# Patient Record
Sex: Female | Born: 1945
Health system: Southern US, Community
[De-identification: ages and names within clinical notes are randomized; demographics above are authoritative.]

## PROBLEM LIST (undated history)

## (undated) DIAGNOSIS — K219 Gastro-esophageal reflux disease without esophagitis: Secondary | ICD-10-CM

## (undated) DIAGNOSIS — E785 Hyperlipidemia, unspecified: Secondary | ICD-10-CM

## (undated) HISTORY — DX: Hyperlipidemia, unspecified: E78.5

## (undated) HISTORY — DX: Gastro-esophageal reflux disease without esophagitis: K21.9

## (undated) HISTORY — PX: LAPAROSCOPY ABDOMEN DIAGNOSTIC: PRO50

---
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
## 1988-02-14 HISTORY — PX: TUBAL LIGATION: SHX77

## 1999-06-14 ENCOUNTER — Other Ambulatory Visit: Admission: RE | Admit: 1999-06-14 | Discharge: 1999-06-14 | Payer: Self-pay | Admitting: Family Medicine

## 1999-06-22 ENCOUNTER — Encounter: Admission: RE | Admit: 1999-06-22 | Discharge: 1999-06-22 | Payer: Self-pay | Admitting: Family Medicine

## 1999-06-22 ENCOUNTER — Encounter: Payer: Self-pay | Admitting: Family Medicine

## 1999-07-04 ENCOUNTER — Encounter: Payer: Self-pay | Admitting: Internal Medicine

## 1999-07-13 ENCOUNTER — Ambulatory Visit (HOSPITAL_COMMUNITY): Admission: RE | Admit: 1999-07-13 | Discharge: 1999-07-13 | Payer: Self-pay | Admitting: Gastroenterology

## 1999-07-13 ENCOUNTER — Encounter: Payer: Self-pay | Admitting: Internal Medicine

## 1999-11-21 ENCOUNTER — Encounter: Payer: Self-pay | Admitting: Family Medicine

## 1999-11-21 ENCOUNTER — Encounter: Admission: RE | Admit: 1999-11-21 | Discharge: 1999-11-21 | Payer: Self-pay | Admitting: Family Medicine

## 2000-11-22 ENCOUNTER — Encounter: Payer: Self-pay | Admitting: Family Medicine

## 2000-11-22 ENCOUNTER — Encounter: Admission: RE | Admit: 2000-11-22 | Discharge: 2000-11-22 | Payer: Self-pay | Admitting: Family Medicine

## 2001-08-19 ENCOUNTER — Other Ambulatory Visit: Admission: RE | Admit: 2001-08-19 | Discharge: 2001-08-19 | Payer: Self-pay | Admitting: Family Medicine

## 2001-08-29 ENCOUNTER — Encounter: Admission: RE | Admit: 2001-08-29 | Discharge: 2001-08-29 | Payer: Self-pay | Admitting: Family Medicine

## 2001-08-29 ENCOUNTER — Encounter: Payer: Self-pay | Admitting: Family Medicine

## 2002-01-14 ENCOUNTER — Encounter: Payer: Self-pay | Admitting: Family Medicine

## 2002-01-14 ENCOUNTER — Encounter: Admission: RE | Admit: 2002-01-14 | Discharge: 2002-01-14 | Payer: Self-pay | Admitting: Family Medicine

## 2002-08-25 ENCOUNTER — Other Ambulatory Visit: Admission: RE | Admit: 2002-08-25 | Discharge: 2002-08-25 | Payer: Self-pay | Admitting: Obstetrics and Gynecology

## 2003-10-09 ENCOUNTER — Other Ambulatory Visit: Admission: RE | Admit: 2003-10-09 | Discharge: 2003-10-09 | Payer: Self-pay | Admitting: Obstetrics and Gynecology

## 2003-11-03 ENCOUNTER — Encounter: Admission: RE | Admit: 2003-11-03 | Discharge: 2003-11-03 | Payer: Self-pay | Admitting: Obstetrics and Gynecology

## 2004-11-23 ENCOUNTER — Other Ambulatory Visit: Admission: RE | Admit: 2004-11-23 | Discharge: 2004-11-23 | Payer: Self-pay | Admitting: Obstetrics and Gynecology

## 2004-12-26 ENCOUNTER — Encounter: Admission: RE | Admit: 2004-12-26 | Discharge: 2004-12-26 | Payer: Self-pay | Admitting: Obstetrics and Gynecology

## 2005-02-13 HISTORY — PX: CARPAL TUNNEL RELEASE: SHX101

## 2005-07-13 ENCOUNTER — Ambulatory Visit: Payer: Self-pay | Admitting: Internal Medicine

## 2006-01-23 ENCOUNTER — Encounter: Admission: RE | Admit: 2006-01-23 | Discharge: 2006-01-23 | Payer: Self-pay | Admitting: Internal Medicine

## 2007-06-05 ENCOUNTER — Encounter: Admission: RE | Admit: 2007-06-05 | Discharge: 2007-06-05 | Payer: Self-pay | Admitting: Family Medicine

## 2008-04-06 ENCOUNTER — Other Ambulatory Visit: Admission: RE | Admit: 2008-04-06 | Discharge: 2008-04-06 | Payer: Self-pay | Admitting: Family Medicine

## 2008-06-05 ENCOUNTER — Encounter: Admission: RE | Admit: 2008-06-05 | Discharge: 2008-06-05 | Payer: Self-pay | Admitting: Family Medicine

## 2009-02-13 HISTORY — PX: COLONOSCOPY: SHX174

## 2009-06-07 ENCOUNTER — Encounter: Admission: RE | Admit: 2009-06-07 | Discharge: 2009-06-07 | Payer: Self-pay | Admitting: Family Medicine

## 2009-07-15 ENCOUNTER — Other Ambulatory Visit: Admission: RE | Admit: 2009-07-15 | Discharge: 2009-07-15 | Payer: Self-pay | Admitting: Family Medicine

## 2009-07-21 ENCOUNTER — Encounter (INDEPENDENT_AMBULATORY_CARE_PROVIDER_SITE_OTHER): Payer: Self-pay | Admitting: *Deleted

## 2009-07-29 ENCOUNTER — Encounter (INDEPENDENT_AMBULATORY_CARE_PROVIDER_SITE_OTHER): Payer: Self-pay | Admitting: *Deleted

## 2009-08-02 ENCOUNTER — Ambulatory Visit: Payer: Self-pay | Admitting: Internal Medicine

## 2009-08-18 ENCOUNTER — Ambulatory Visit: Payer: Self-pay | Admitting: Internal Medicine

## 2010-03-06 ENCOUNTER — Encounter: Payer: Self-pay | Admitting: Family Medicine

## 2010-03-17 NOTE — Procedures (Signed)
Summary: Colonoscopy  Patient: Susan Ho Note: All result statuses are Final unless otherwise noted.  Tests: (1) Colonoscopy (COL)   COL Colonoscopy           DONE     [facility]     [address]     [place], [place]  [address]           COLONOSCOPY PROCEDURE REPORT           PATIENT:  Susan Ho, Susan Ho  MR#:  [id number]     BIRTHDATE:  25-Jan-1946, 64 yrs. old  GENDER:  female     ENDOSCOPIST:  Iva Boop, MD, [facility]     REF. BY:  Juluis Rainier, M.D.     PROCEDURE DATE:  08/18/2009     PROCEDURE:  Colonoscopy [address]     ASA CLASS:  Class II     INDICATIONS:  Routine Risk Screening     MEDICATIONS:   Fentanyl 50 mcg IV, Versed 6 mg           DESCRIPTION OF PROCEDURE:   After the risks benefits and     alternatives of the procedure were thoroughly explained, informed     consent was obtained.  Digital rectal exam was performed and     revealed no abnormalities.   The LB PCF-H180AL [id number] endoscope     was introduced through the anus and advanced to the cecum, which     was identified by both the appendix and ileocecal valve, without     limitations.  The quality of the prep was excellent, using     MoviPrep.  The instrument was then slowly withdrawn as the colon     was fully examined. Insertion:  3:45 minutes Withdrawal: 10:04     minutes     <<PROCEDUREIMAGES>>           FINDINGS:  Severe diverticulosis was found in the sigmoid colon.     This was otherwise a normal examination of the colon.   Retroflexed     views in the rectum revealed no abnormalities.    The scope was     then withdrawn from the patient and the procedure completed.           COMPLICATIONS:  None     ENDOSCOPIC IMPRESSION:           1) Severe diverticulosis in the sigmoid colon     2) Otherwise normal examination, excellent prep           REPEAT EXAM:  In 10 year(s) for routine screening colonoscopy.           Iva Boop, MD, [facility]           CC:  Juluis Rainier, MD     The  Patient           n.     eSIGNED:   Iva Boop at 08/18/2009 02:09 PM           Roselyn Meier, [phone]  Note: An exclamation mark (!) indicates a result that was not dispersed into the flowsheet. Document Creation Date: 08/18/2009 2:09 PM _______________________________________________________________________  (1) Order result status: Final Collection or observation date-time: 08/18/2009 14:00 Requested date-time:  Receipt date-time:  Reported date-time:  Referring Physician:   Ordering Physician: Stan Head [id number]) Specimen Source:  Source: Launa Grill Order Number: [id number] Lab site:   Appended Document: Colonoscopy    Clinical Lists Changes  Observations: Added new observation  of COLONNXTDUE: 08/2019 (08/18/2009 16:07)

## 2010-03-17 NOTE — Miscellaneous (Signed)
Summary: DIR COL...AS.  Clinical Lists Changes  Medications: Added new medication of MOVIPREP 100 GM  SOLR (PEG-KCL-NACL-NASULF-NA ASC-C) As directed - Signed Rx of MOVIPREP 100 GM  SOLR (PEG-KCL-NACL-NASULF-NA ASC-C) As directed;  #1 x 0;  Signed;  Entered by: Clide Cliff RN;  Authorized by: Iva Boop MD, FACG;  Method used: Electronically to [id number]*, [address], [place], [place], [place]  [address], Ph: [phone], Fax: [phone] Allergies: Added new allergy or adverse reaction of SULFA Observations: Added new observation of NKA: F (08/02/2009 10:23)    Prescriptions: MOVIPREP 100 GM  SOLR (PEG-KCL-NACL-NASULF-NA ASC-C) As directed  #1 x 0   Entered by:   Clide Cliff RN   Authorized by:   Iva Boop MD, [facility]   Signed by:   Clide Cliff RN on 08/02/2009   Method used:   Electronically to        [id number] (retail)       [address]       [place], [place]  [address]       Ph: [phone]       Fax: [phone]   RxID:   [id number]

## 2010-03-17 NOTE — Letter (Signed)
Summary: Previsit letter  [facility]  [address] [place], [place] [address]   Phone: [phone]  Fax: [phone]       07/21/2009 MRN: [id number]  Susan Ho [address] [place], [place]  [address]  Dear Ms. Vazques,  Welcome to the Gastroenterology Division at [facility].    You are scheduled to see a nurse for your pre-procedure visit on 08/02/2009 at 10:30AM on the 3rd floor at [facility], [address]  We ask that you try to arrive at our office 15 minutes prior to your appointment time to allow for check-in.  Your nurse visit will consist of discussing your medical and surgical history, your immediate family medical history, and your medications.    Please bring a complete list of all your medications or, if you prefer, bring the medication bottles and we will list them.  We will need to be aware of both prescribed and over the counter drugs.  We will need to know exact dosage information as well.  If you are on blood thinners (Coumadin, Plavix, Aggrenox, Ticlid, etc.) please call our office today/prior to your appointment, as we need to consult with your physician about holding your medication.   Please be prepared to read and sign documents such as consent forms, a financial agreement, and acknowledgement forms.  If necessary, and with your consent, a friend or relative is welcome to sit-in on the nurse visit with you.  Please bring your insurance card so that we may make a copy of it.  If your insurance requires a referral to see a specialist, please bring your referral form from your primary care physician.  No co-pay is required for this nurse visit.     If you cannot keep your appointment, please call [phone] to cancel or reschedule prior to your appointment date.  This allows [place] the opportunity to schedule an appointment for another patient in need of care.    Thank you for choosing [facility] for your medical  needs.  We appreciate the opportunity to care for you.  Please visit [place] at our website  to learn more about our practice.                     Sincerely.                                                                                                                   The Gastroenterology Division

## 2010-03-17 NOTE — Letter (Signed)
Summary: [facility] Instructions  [facility]  [address] [place], [place] [address]   Phone: [phone]  Fax: [phone]       TELESIA ATES    [birth year]    MRN: [id number]        Procedure Day Dorna Bloom:  Wednesday 08/18/2009     Arrival Time: 64:30 pm      Procedure Time: 1:30 pm     Location of Procedure:                    _x _  [facility] (4th Floor)                        PREPARATION FOR COLONOSCOPY WITH MOVIPREP   Starting 5 days prior to your procedure Friday 7/1 do not eat nuts, seeds, popcorn, corn, beans, peas,  salads, or any raw vegetables.  Do not take any fiber supplements (e.g. Metamucil, Citrucel, and Benefiber).  THE DAY BEFORE YOUR PROCEDURE         DATE: Tuesday 7/5  1.  Drink clear liquids the entire day-NO SOLID FOOD  2.  Do not drink anything colored red or purple.  Avoid juices with pulp.  No orange juice.  3.  Drink at least 64 oz. (8 glasses) of fluid/clear liquids during the day to prevent dehydration and help the prep work efficiently.  CLEAR LIQUIDS INCLUDE: Water Jello Ice Popsicles Tea (sugar ok, no milk/cream) Powdered fruit flavored drinks Coffee (sugar ok, no milk/cream) Gatorade Juice: apple, white grape, white cranberry  Lemonade Clear bullion, consomm, broth Carbonated beverages (any kind) Strained chicken noodle soup Hard Candy                             4.  In the morning, mix first dose of MoviPrep solution:    Empty 1 Pouch A and 1 Pouch B into the disposable container    Add lukewarm drinking water to the top line of the container. Mix to dissolve    Refrigerate (mixed solution should be used within 24 hrs)  5.  Begin drinking the prep at 5:00 p.m. The MoviPrep container is divided by 4 marks.   Every 15 minutes drink the solution down to the next mark (approximately 8 oz) until the full liter is complete.   6.  Follow completed prep with 16 oz of clear liquid of your choice (Nothing  red or purple).  Continue to drink clear liquids until bedtime.  7.  Before going to bed, mix second dose of MoviPrep solution:    Empty 1 Pouch A and 1 Pouch B into the disposable container    Add lukewarm drinking water to the top line of the container. Mix to dissolve    Refrigerate  THE DAY OF YOUR PROCEDURE      DATE: Wednesday 7/6  Beginning at 8:30 a.m. (5 hours before procedure):         1. Every 15 minutes, drink the solution down to the next mark (approx 8 oz) until the full liter is complete.  2. Follow completed prep with 16 oz. of clear liquid of your choice.    3. You may drink clear liquids until 11:30 am (2 HOURS BEFORE PROCEDURE).   MEDICATION INSTRUCTIONS  Unless otherwise instructed, you should take regular prescription medications with a small sip of water   as early as possible the morning of  your procedure.         OTHER INSTRUCTIONS  You will need a responsible adult at least 64 years of age to accompany you and drive you home.   This person must remain in the waiting room during your procedure.  Wear loose fitting clothing that is easily removed.  Leave jewelry and other valuables at home.  However, you may wish to bring a book to read or  an iPod/MP3 player to listen to music as you wait for your procedure to start.  Remove all body piercing jewelry and leave at home.  Total time from sign-in until discharge is approximately 2-3 hours.  You should go home directly after your procedure and rest.  You can resume normal activities the  day after your procedure.  The day of your procedure you should not:   Drive   Make legal decisions   Operate machinery   Drink alcohol   Return to work  You will receive specific instructions about eating, activities and medications before you leave.    The above instructions have been reviewed and explained to me by   Clide Cliff, RN______________________    I fully understand and can  verbalize these instructions _____________________________ Date _________

## 2010-03-17 NOTE — Procedures (Signed)
Summary: Colonoscopy/[place]  Colonoscopy/[place]   Imported By: Sherian Rein 09/10/2009 11:17:18  _____________________________________________________________________  External Attachment:    Type:   Image     Comment:   External Document

## 2010-03-17 NOTE — Letter (Signed)
Summary: [facility]   Imported By: Sherian Rein 09/10/2009 11:18:14  _____________________________________________________________________  External Attachment:    Type:   Image     Comment:   External Document

## 2010-06-01 ENCOUNTER — Other Ambulatory Visit: Payer: Self-pay | Admitting: Family Medicine

## 2010-06-01 DIAGNOSIS — Z1231 Encounter for screening mammogram for malignant neoplasm of breast: Secondary | ICD-10-CM

## 2010-06-01 DIAGNOSIS — M8580 Other specified disorders of bone density and structure, unspecified site: Secondary | ICD-10-CM

## 2010-07-01 NOTE — Procedures (Signed)
[facility]  Patient:    Susan Ho, Susan Ho                      MRN: [id number] Proc. Date: 07/13/99 Adm. Date:  [id number] Disc. Date: [id number] Attending:  Deneen Harts CC:         Talmadge Coventry, M.D.                           Procedure Report  PROCEDURE:  Colonoscopy.  INDICATION:  A 65 year old white female with Hemoccult-positive stool, undergoing colonoscopy for neoplasia surveillance.  Not previously performed.  DESCRIPTION OF PROCEDURE:  After reviewing the nature of the procedure with the patient, including potential risks and complications, and after discussing alternative methods of diagnosis and treatment, informed consent was signed.  The patient was premedicated, receiving IV sedation totalling Versed 5 mg, fentanyl 50 mcg, administered in IV in divided doses prior to the onset of the procedure.  Using an Olympus PCF-140L video colonoscope, rectum was intubated after digital examination revealed no evidence of perianal or intrarectal pathology. The scope was inserted and easily advanced around the entire length of the colon without difficulty.  Cecum identified by the appendiceal orifice and intubation of the terminal ileum.  Excellent preparation throughout.  The scope was slowly withdrawn with careful inspection of the TI, cecum, and remainder of the colon.  No abnormality was noted.  Specifically, there was no evidence of neoplasia, no lesion to explain Hemoccult-positive stool. Retroflex view in the rectal vault was normal.  Specifically, there was no evidence of neoplasia, mucosal inflammation, vascular lesion, or diverticular disease.  The colon was decompressed, scope withdrawn.  The patient tolerated the procedure without difficulty, being maintained on Dayscope monitor, low-flow oxygen throughout.  Returned to recovery in stable condition.  Time 1, technical 1, preparation 1, total score equal to 3.  ASSESSMENT:   Normal colonoscopy, suspect false positive Hemoccult.  RECOMMENDATION: 1. Annual Hemoccult. 2. Colonoscopy in 10 years. 3. Consider endoscopy if upper GI symptoms develop or repeat Hemoccult is    positive. DD:  07/13/99 TD:  07/18/99 Job: [id number]

## 2010-07-05 ENCOUNTER — Ambulatory Visit
Admission: RE | Admit: 2010-07-05 | Discharge: 2010-07-05 | Disposition: A | Discharge status: Home / Self Care | Payer: Private Health Insurance - Indemnity | Source: Ambulatory Visit | Attending: Family Medicine | Admitting: Family Medicine

## 2010-07-05 DIAGNOSIS — M8580 Other specified disorders of bone density and structure, unspecified site: Secondary | ICD-10-CM

## 2010-07-05 DIAGNOSIS — Z1231 Encounter for screening mammogram for malignant neoplasm of breast: Secondary | ICD-10-CM

## 2011-06-12 ENCOUNTER — Other Ambulatory Visit: Payer: Self-pay | Admitting: Family Medicine

## 2011-06-12 DIAGNOSIS — Z1231 Encounter for screening mammogram for malignant neoplasm of breast: Secondary | ICD-10-CM

## 2011-07-13 ENCOUNTER — Ambulatory Visit
Admission: RE | Admit: 2011-07-13 | Discharge: 2011-07-13 | Disposition: A | Discharge status: Home / Self Care | Payer: BC Managed Care – PPO | Source: Ambulatory Visit | Attending: Family Medicine | Admitting: Family Medicine

## 2011-07-13 DIAGNOSIS — Z1231 Encounter for screening mammogram for malignant neoplasm of breast: Secondary | ICD-10-CM

## 2012-06-20 ENCOUNTER — Other Ambulatory Visit: Payer: Self-pay

## 2012-06-20 DIAGNOSIS — Z1231 Encounter for screening mammogram for malignant neoplasm of breast: Secondary | ICD-10-CM

## 2012-06-20 DIAGNOSIS — M810 Age-related osteoporosis without current pathological fracture: Secondary | ICD-10-CM

## 2012-07-15 ENCOUNTER — Ambulatory Visit
Admission: RE | Admit: 2012-07-15 | Discharge: 2012-07-15 | Disposition: A | Discharge status: Home / Self Care | Payer: Medicare Other | Source: Ambulatory Visit

## 2012-07-15 DIAGNOSIS — M810 Age-related osteoporosis without current pathological fracture: Secondary | ICD-10-CM

## 2012-07-15 DIAGNOSIS — Z1231 Encounter for screening mammogram for malignant neoplasm of breast: Secondary | ICD-10-CM

## 2013-09-16 ENCOUNTER — Other Ambulatory Visit: Payer: Self-pay

## 2013-09-16 DIAGNOSIS — Z1231 Encounter for screening mammogram for malignant neoplasm of breast: Secondary | ICD-10-CM

## 2013-10-01 ENCOUNTER — Ambulatory Visit: Payer: Medicare Other

## 2013-10-03 ENCOUNTER — Ambulatory Visit
Admission: RE | Admit: 2013-10-03 | Discharge: 2013-10-03 | Disposition: A | Discharge status: Home / Self Care | Payer: Medicare Other | Source: Ambulatory Visit

## 2013-10-03 DIAGNOSIS — Z1231 Encounter for screening mammogram for malignant neoplasm of breast: Secondary | ICD-10-CM

## 2014-06-19 ENCOUNTER — Encounter: Payer: Self-pay | Admitting: Internal Medicine

## 2014-11-12 ENCOUNTER — Other Ambulatory Visit: Payer: Self-pay | Admitting: Family Medicine

## 2014-11-12 DIAGNOSIS — Z1231 Encounter for screening mammogram for malignant neoplasm of breast: Secondary | ICD-10-CM

## 2014-11-12 DIAGNOSIS — E2839 Other primary ovarian failure: Secondary | ICD-10-CM

## 2014-12-15 ENCOUNTER — Ambulatory Visit
Admission: RE | Admit: 2014-12-15 | Discharge: 2014-12-15 | Disposition: A | Discharge status: Home / Self Care | Payer: Medicare Other | Source: Ambulatory Visit | Attending: Family Medicine | Admitting: Family Medicine

## 2014-12-15 DIAGNOSIS — E2839 Other primary ovarian failure: Secondary | ICD-10-CM

## 2014-12-15 DIAGNOSIS — Z1231 Encounter for screening mammogram for malignant neoplasm of breast: Secondary | ICD-10-CM

## 2015-04-20 DIAGNOSIS — Z6832 Body mass index (BMI) 32.0-32.9, adult: Secondary | ICD-10-CM | POA: Diagnosis not present

## 2015-04-20 DIAGNOSIS — L570 Actinic keratosis: Secondary | ICD-10-CM | POA: Diagnosis not present

## 2015-05-04 DIAGNOSIS — H04121 Dry eye syndrome of right lacrimal gland: Secondary | ICD-10-CM | POA: Diagnosis not present

## 2015-05-04 DIAGNOSIS — H02834 Dermatochalasis of left upper eyelid: Secondary | ICD-10-CM | POA: Diagnosis not present

## 2015-05-04 DIAGNOSIS — H04123 Dry eye syndrome of bilateral lacrimal glands: Secondary | ICD-10-CM | POA: Diagnosis not present

## 2015-05-04 DIAGNOSIS — H04122 Dry eye syndrome of left lacrimal gland: Secondary | ICD-10-CM | POA: Diagnosis not present

## 2015-05-04 DIAGNOSIS — H01001 Unspecified blepharitis right upper eyelid: Secondary | ICD-10-CM | POA: Diagnosis not present

## 2015-05-04 DIAGNOSIS — H01004 Unspecified blepharitis left upper eyelid: Secondary | ICD-10-CM | POA: Diagnosis not present

## 2015-05-04 DIAGNOSIS — H02413 Mechanical ptosis of bilateral eyelids: Secondary | ICD-10-CM | POA: Diagnosis not present

## 2015-05-04 DIAGNOSIS — H02831 Dermatochalasis of right upper eyelid: Secondary | ICD-10-CM | POA: Diagnosis not present

## 2015-05-20 DIAGNOSIS — H02834 Dermatochalasis of left upper eyelid: Secondary | ICD-10-CM | POA: Diagnosis not present

## 2015-05-20 DIAGNOSIS — H02413 Mechanical ptosis of bilateral eyelids: Secondary | ICD-10-CM | POA: Diagnosis not present

## 2015-05-20 DIAGNOSIS — H02831 Dermatochalasis of right upper eyelid: Secondary | ICD-10-CM | POA: Diagnosis not present

## 2015-07-19 DIAGNOSIS — Z6831 Body mass index (BMI) 31.0-31.9, adult: Secondary | ICD-10-CM | POA: Diagnosis not present

## 2015-07-19 DIAGNOSIS — Z23 Encounter for immunization: Secondary | ICD-10-CM | POA: Diagnosis not present

## 2015-07-19 DIAGNOSIS — Z1389 Encounter for screening for other disorder: Secondary | ICD-10-CM | POA: Diagnosis not present

## 2015-07-19 DIAGNOSIS — Z79899 Other long term (current) drug therapy: Secondary | ICD-10-CM | POA: Diagnosis not present

## 2015-07-19 DIAGNOSIS — Z0000 Encounter for general adult medical examination without abnormal findings: Secondary | ICD-10-CM | POA: Diagnosis not present

## 2015-08-06 DIAGNOSIS — H04122 Dry eye syndrome of left lacrimal gland: Secondary | ICD-10-CM | POA: Diagnosis not present

## 2015-08-06 DIAGNOSIS — H04123 Dry eye syndrome of bilateral lacrimal glands: Secondary | ICD-10-CM | POA: Diagnosis not present

## 2015-08-06 DIAGNOSIS — H04121 Dry eye syndrome of right lacrimal gland: Secondary | ICD-10-CM | POA: Diagnosis not present

## 2015-10-20 DIAGNOSIS — Z23 Encounter for immunization: Secondary | ICD-10-CM | POA: Diagnosis not present

## 2015-11-01 DIAGNOSIS — L821 Other seborrheic keratosis: Secondary | ICD-10-CM | POA: Diagnosis not present

## 2015-11-01 DIAGNOSIS — L718 Other rosacea: Secondary | ICD-10-CM | POA: Diagnosis not present

## 2015-11-18 ENCOUNTER — Other Ambulatory Visit: Payer: Self-pay | Admitting: Family Medicine

## 2015-11-18 DIAGNOSIS — Z1231 Encounter for screening mammogram for malignant neoplasm of breast: Secondary | ICD-10-CM

## 2015-12-28 ENCOUNTER — Ambulatory Visit
Admission: RE | Admit: 2015-12-28 | Discharge: 2015-12-28 | Disposition: A | Discharge status: Home / Self Care | Payer: Medicare Other | Source: Ambulatory Visit | Attending: Family Medicine | Admitting: Family Medicine

## 2015-12-28 DIAGNOSIS — Z1231 Encounter for screening mammogram for malignant neoplasm of breast: Secondary | ICD-10-CM

## 2015-12-30 DIAGNOSIS — H25813 Combined forms of age-related cataract, bilateral: Secondary | ICD-10-CM | POA: Diagnosis not present

## 2016-07-28 DIAGNOSIS — Z23 Encounter for immunization: Secondary | ICD-10-CM | POA: Diagnosis not present

## 2016-08-22 DIAGNOSIS — M50323 Other cervical disc degeneration at C6-C7 level: Secondary | ICD-10-CM | POA: Diagnosis not present

## 2016-08-22 DIAGNOSIS — M531 Cervicobrachial syndrome: Secondary | ICD-10-CM | POA: Diagnosis not present

## 2016-08-22 DIAGNOSIS — G540 Brachial plexus disorders: Secondary | ICD-10-CM | POA: Diagnosis not present

## 2016-08-22 DIAGNOSIS — M9901 Segmental and somatic dysfunction of cervical region: Secondary | ICD-10-CM | POA: Diagnosis not present

## 2016-08-24 DIAGNOSIS — M531 Cervicobrachial syndrome: Secondary | ICD-10-CM | POA: Diagnosis not present

## 2016-08-24 DIAGNOSIS — M50323 Other cervical disc degeneration at C6-C7 level: Secondary | ICD-10-CM | POA: Diagnosis not present

## 2016-08-24 DIAGNOSIS — G540 Brachial plexus disorders: Secondary | ICD-10-CM | POA: Diagnosis not present

## 2016-08-24 DIAGNOSIS — M9901 Segmental and somatic dysfunction of cervical region: Secondary | ICD-10-CM | POA: Diagnosis not present

## 2016-08-28 DIAGNOSIS — M9901 Segmental and somatic dysfunction of cervical region: Secondary | ICD-10-CM | POA: Diagnosis not present

## 2016-08-28 DIAGNOSIS — M531 Cervicobrachial syndrome: Secondary | ICD-10-CM | POA: Diagnosis not present

## 2016-08-28 DIAGNOSIS — M50323 Other cervical disc degeneration at C6-C7 level: Secondary | ICD-10-CM | POA: Diagnosis not present

## 2016-08-28 DIAGNOSIS — G540 Brachial plexus disorders: Secondary | ICD-10-CM | POA: Diagnosis not present

## 2016-08-29 DIAGNOSIS — R209 Unspecified disturbances of skin sensation: Secondary | ICD-10-CM | POA: Diagnosis not present

## 2016-08-29 DIAGNOSIS — Z6831 Body mass index (BMI) 31.0-31.9, adult: Secondary | ICD-10-CM | POA: Diagnosis not present

## 2016-08-29 DIAGNOSIS — M542 Cervicalgia: Secondary | ICD-10-CM | POA: Diagnosis not present

## 2016-09-12 DIAGNOSIS — M6281 Muscle weakness (generalized): Secondary | ICD-10-CM | POA: Diagnosis not present

## 2016-09-12 DIAGNOSIS — M5412 Radiculopathy, cervical region: Secondary | ICD-10-CM | POA: Diagnosis not present

## 2016-09-12 DIAGNOSIS — M542 Cervicalgia: Secondary | ICD-10-CM | POA: Diagnosis not present

## 2016-09-13 DIAGNOSIS — M542 Cervicalgia: Secondary | ICD-10-CM | POA: Diagnosis not present

## 2016-09-13 DIAGNOSIS — M6281 Muscle weakness (generalized): Secondary | ICD-10-CM | POA: Diagnosis not present

## 2016-10-02 DIAGNOSIS — M542 Cervicalgia: Secondary | ICD-10-CM | POA: Diagnosis not present

## 2016-10-02 DIAGNOSIS — M5412 Radiculopathy, cervical region: Secondary | ICD-10-CM | POA: Diagnosis not present

## 2016-10-02 DIAGNOSIS — M6281 Muscle weakness (generalized): Secondary | ICD-10-CM | POA: Diagnosis not present

## 2016-10-05 DIAGNOSIS — M6281 Muscle weakness (generalized): Secondary | ICD-10-CM | POA: Diagnosis not present

## 2016-10-05 DIAGNOSIS — M542 Cervicalgia: Secondary | ICD-10-CM | POA: Diagnosis not present

## 2016-10-05 DIAGNOSIS — M5412 Radiculopathy, cervical region: Secondary | ICD-10-CM | POA: Diagnosis not present

## 2016-10-10 DIAGNOSIS — M5412 Radiculopathy, cervical region: Secondary | ICD-10-CM | POA: Diagnosis not present

## 2016-10-10 DIAGNOSIS — M542 Cervicalgia: Secondary | ICD-10-CM | POA: Diagnosis not present

## 2016-10-10 DIAGNOSIS — M6281 Muscle weakness (generalized): Secondary | ICD-10-CM | POA: Diagnosis not present

## 2016-10-12 DIAGNOSIS — M542 Cervicalgia: Secondary | ICD-10-CM | POA: Diagnosis not present

## 2016-10-12 DIAGNOSIS — M6281 Muscle weakness (generalized): Secondary | ICD-10-CM | POA: Diagnosis not present

## 2016-10-12 DIAGNOSIS — M5412 Radiculopathy, cervical region: Secondary | ICD-10-CM | POA: Diagnosis not present

## 2016-10-17 DIAGNOSIS — M6281 Muscle weakness (generalized): Secondary | ICD-10-CM | POA: Diagnosis not present

## 2016-10-17 DIAGNOSIS — M5412 Radiculopathy, cervical region: Secondary | ICD-10-CM | POA: Diagnosis not present

## 2016-10-17 DIAGNOSIS — M542 Cervicalgia: Secondary | ICD-10-CM | POA: Diagnosis not present

## 2016-10-19 DIAGNOSIS — E782 Mixed hyperlipidemia: Secondary | ICD-10-CM | POA: Diagnosis not present

## 2016-10-19 DIAGNOSIS — Z6830 Body mass index (BMI) 30.0-30.9, adult: Secondary | ICD-10-CM | POA: Diagnosis not present

## 2016-10-19 DIAGNOSIS — Z23 Encounter for immunization: Secondary | ICD-10-CM | POA: Diagnosis not present

## 2016-10-19 DIAGNOSIS — Z0000 Encounter for general adult medical examination without abnormal findings: Secondary | ICD-10-CM | POA: Diagnosis not present

## 2016-10-24 DIAGNOSIS — M5412 Radiculopathy, cervical region: Secondary | ICD-10-CM | POA: Diagnosis not present

## 2016-10-24 DIAGNOSIS — M6281 Muscle weakness (generalized): Secondary | ICD-10-CM | POA: Diagnosis not present

## 2016-10-24 DIAGNOSIS — M542 Cervicalgia: Secondary | ICD-10-CM | POA: Diagnosis not present

## 2016-10-26 DIAGNOSIS — M542 Cervicalgia: Secondary | ICD-10-CM | POA: Diagnosis not present

## 2016-10-26 DIAGNOSIS — M6281 Muscle weakness (generalized): Secondary | ICD-10-CM | POA: Diagnosis not present

## 2016-10-26 DIAGNOSIS — M5412 Radiculopathy, cervical region: Secondary | ICD-10-CM | POA: Diagnosis not present

## 2016-11-02 DIAGNOSIS — M542 Cervicalgia: Secondary | ICD-10-CM | POA: Diagnosis not present

## 2016-11-02 DIAGNOSIS — M6281 Muscle weakness (generalized): Secondary | ICD-10-CM | POA: Diagnosis not present

## 2016-11-07 DIAGNOSIS — M5412 Radiculopathy, cervical region: Secondary | ICD-10-CM | POA: Diagnosis not present

## 2016-11-07 DIAGNOSIS — M542 Cervicalgia: Secondary | ICD-10-CM | POA: Diagnosis not present

## 2016-11-07 DIAGNOSIS — M6281 Muscle weakness (generalized): Secondary | ICD-10-CM | POA: Diagnosis not present

## 2016-11-09 DIAGNOSIS — M6281 Muscle weakness (generalized): Secondary | ICD-10-CM | POA: Diagnosis not present

## 2016-11-09 DIAGNOSIS — M542 Cervicalgia: Secondary | ICD-10-CM | POA: Diagnosis not present

## 2016-11-17 ENCOUNTER — Other Ambulatory Visit: Payer: Self-pay | Admitting: Family Medicine

## 2016-11-17 DIAGNOSIS — R5381 Other malaise: Secondary | ICD-10-CM

## 2016-11-28 ENCOUNTER — Other Ambulatory Visit: Payer: Self-pay | Admitting: Family Medicine

## 2016-11-28 DIAGNOSIS — M8580 Other specified disorders of bone density and structure, unspecified site: Secondary | ICD-10-CM

## 2016-12-08 ENCOUNTER — Other Ambulatory Visit: Payer: Self-pay | Admitting: Family Medicine

## 2016-12-08 DIAGNOSIS — Z1231 Encounter for screening mammogram for malignant neoplasm of breast: Secondary | ICD-10-CM

## 2016-12-28 DIAGNOSIS — H02821 Cysts of right upper eyelid: Secondary | ICD-10-CM | POA: Diagnosis not present

## 2016-12-28 DIAGNOSIS — H25813 Combined forms of age-related cataract, bilateral: Secondary | ICD-10-CM | POA: Diagnosis not present

## 2017-01-09 ENCOUNTER — Ambulatory Visit
Admission: RE | Admit: 2017-01-09 | Discharge: 2017-01-09 | Disposition: A | Discharge status: Home / Self Care | Payer: Medicare Other | Source: Ambulatory Visit | Attending: Family Medicine | Admitting: Family Medicine

## 2017-01-09 DIAGNOSIS — M8580 Other specified disorders of bone density and structure, unspecified site: Secondary | ICD-10-CM

## 2017-01-09 DIAGNOSIS — M8589 Other specified disorders of bone density and structure, multiple sites: Secondary | ICD-10-CM | POA: Diagnosis not present

## 2017-01-09 DIAGNOSIS — Z780 Asymptomatic menopausal state: Secondary | ICD-10-CM | POA: Diagnosis not present

## 2017-01-09 DIAGNOSIS — Z1231 Encounter for screening mammogram for malignant neoplasm of breast: Secondary | ICD-10-CM | POA: Diagnosis not present

## 2017-03-26 DIAGNOSIS — L570 Actinic keratosis: Secondary | ICD-10-CM | POA: Diagnosis not present

## 2017-03-26 DIAGNOSIS — L578 Other skin changes due to chronic exposure to nonionizing radiation: Secondary | ICD-10-CM | POA: Diagnosis not present

## 2017-03-26 DIAGNOSIS — L821 Other seborrheic keratosis: Secondary | ICD-10-CM | POA: Diagnosis not present

## 2017-03-26 DIAGNOSIS — L718 Other rosacea: Secondary | ICD-10-CM | POA: Diagnosis not present

## 2017-07-03 DIAGNOSIS — L299 Pruritus, unspecified: Secondary | ICD-10-CM | POA: Diagnosis not present

## 2017-07-03 DIAGNOSIS — H6121 Impacted cerumen, right ear: Secondary | ICD-10-CM | POA: Diagnosis not present

## 2017-07-03 DIAGNOSIS — N3941 Urge incontinence: Secondary | ICD-10-CM | POA: Diagnosis not present

## 2017-11-22 DIAGNOSIS — E785 Hyperlipidemia, unspecified: Secondary | ICD-10-CM | POA: Diagnosis not present

## 2017-11-22 DIAGNOSIS — Z23 Encounter for immunization: Secondary | ICD-10-CM | POA: Diagnosis not present

## 2017-11-22 DIAGNOSIS — Z0000 Encounter for general adult medical examination without abnormal findings: Secondary | ICD-10-CM | POA: Diagnosis not present

## 2017-11-22 DIAGNOSIS — E559 Vitamin D deficiency, unspecified: Secondary | ICD-10-CM | POA: Diagnosis not present

## 2017-11-22 DIAGNOSIS — M542 Cervicalgia: Secondary | ICD-10-CM | POA: Diagnosis not present

## 2018-01-01 DIAGNOSIS — H25813 Combined forms of age-related cataract, bilateral: Secondary | ICD-10-CM | POA: Diagnosis not present

## 2018-01-01 DIAGNOSIS — H02821 Cysts of right upper eyelid: Secondary | ICD-10-CM | POA: Diagnosis not present

## 2018-01-14 DIAGNOSIS — Z23 Encounter for immunization: Secondary | ICD-10-CM | POA: Diagnosis not present

## 2018-01-14 DIAGNOSIS — Z0000 Encounter for general adult medical examination without abnormal findings: Secondary | ICD-10-CM | POA: Diagnosis not present

## 2018-02-20 DIAGNOSIS — Z1231 Encounter for screening mammogram for malignant neoplasm of breast: Secondary | ICD-10-CM | POA: Diagnosis not present

## 2018-03-01 DIAGNOSIS — R928 Other abnormal and inconclusive findings on diagnostic imaging of breast: Secondary | ICD-10-CM | POA: Diagnosis not present

## 2018-04-01 DIAGNOSIS — L814 Other melanin hyperpigmentation: Secondary | ICD-10-CM | POA: Diagnosis not present

## 2018-04-01 DIAGNOSIS — L821 Other seborrheic keratosis: Secondary | ICD-10-CM | POA: Diagnosis not present

## 2018-04-01 DIAGNOSIS — L578 Other skin changes due to chronic exposure to nonionizing radiation: Secondary | ICD-10-CM | POA: Diagnosis not present

## 2018-04-01 DIAGNOSIS — D1801 Hemangioma of skin and subcutaneous tissue: Secondary | ICD-10-CM | POA: Diagnosis not present

## 2018-10-31 DIAGNOSIS — Z23 Encounter for immunization: Secondary | ICD-10-CM | POA: Diagnosis not present

## 2018-10-31 DIAGNOSIS — H6123 Impacted cerumen, bilateral: Secondary | ICD-10-CM | POA: Diagnosis not present

## 2018-12-03 DIAGNOSIS — H6123 Impacted cerumen, bilateral: Secondary | ICD-10-CM | POA: Diagnosis not present

## 2018-12-03 DIAGNOSIS — M8580 Other specified disorders of bone density and structure, unspecified site: Secondary | ICD-10-CM | POA: Diagnosis not present

## 2018-12-03 DIAGNOSIS — K219 Gastro-esophageal reflux disease without esophagitis: Secondary | ICD-10-CM | POA: Diagnosis not present

## 2019-01-02 DIAGNOSIS — H25813 Combined forms of age-related cataract, bilateral: Secondary | ICD-10-CM | POA: Diagnosis not present

## 2019-02-21 DIAGNOSIS — B369 Superficial mycosis, unspecified: Secondary | ICD-10-CM | POA: Diagnosis not present

## 2019-02-21 DIAGNOSIS — H6240 Otitis externa in other diseases classified elsewhere, unspecified ear: Secondary | ICD-10-CM | POA: Diagnosis not present

## 2019-02-21 DIAGNOSIS — H60543 Acute eczematoid otitis externa, bilateral: Secondary | ICD-10-CM | POA: Diagnosis not present

## 2019-03-31 DIAGNOSIS — B078 Other viral warts: Secondary | ICD-10-CM | POA: Diagnosis not present

## 2019-03-31 DIAGNOSIS — L821 Other seborrheic keratosis: Secondary | ICD-10-CM | POA: Diagnosis not present

## 2019-03-31 DIAGNOSIS — D485 Neoplasm of uncertain behavior of skin: Secondary | ICD-10-CM | POA: Diagnosis not present

## 2019-03-31 DIAGNOSIS — L578 Other skin changes due to chronic exposure to nonionizing radiation: Secondary | ICD-10-CM | POA: Diagnosis not present

## 2019-03-31 DIAGNOSIS — L820 Inflamed seborrheic keratosis: Secondary | ICD-10-CM | POA: Diagnosis not present

## 2019-05-19 DIAGNOSIS — Z1231 Encounter for screening mammogram for malignant neoplasm of breast: Secondary | ICD-10-CM | POA: Diagnosis not present

## 2019-05-19 DIAGNOSIS — M8589 Other specified disorders of bone density and structure, multiple sites: Secondary | ICD-10-CM | POA: Diagnosis not present

## 2019-05-19 DIAGNOSIS — M8580 Other specified disorders of bone density and structure, unspecified site: Secondary | ICD-10-CM | POA: Diagnosis not present

## 2019-05-19 DIAGNOSIS — Z780 Asymptomatic menopausal state: Secondary | ICD-10-CM | POA: Diagnosis not present

## 2019-06-04 DIAGNOSIS — E785 Hyperlipidemia, unspecified: Secondary | ICD-10-CM | POA: Diagnosis not present

## 2019-06-04 DIAGNOSIS — Z1159 Encounter for screening for other viral diseases: Secondary | ICD-10-CM | POA: Diagnosis not present

## 2019-06-04 DIAGNOSIS — G250 Essential tremor: Secondary | ICD-10-CM | POA: Diagnosis not present

## 2019-12-10 DIAGNOSIS — Z0000 Encounter for general adult medical examination without abnormal findings: Secondary | ICD-10-CM | POA: Diagnosis not present

## 2019-12-10 DIAGNOSIS — Z23 Encounter for immunization: Secondary | ICD-10-CM | POA: Diagnosis not present

## 2020-01-06 DIAGNOSIS — H25813 Combined forms of age-related cataract, bilateral: Secondary | ICD-10-CM | POA: Diagnosis not present

## 2020-03-29 DIAGNOSIS — L820 Inflamed seborrheic keratosis: Secondary | ICD-10-CM | POA: Diagnosis not present

## 2020-03-29 DIAGNOSIS — L578 Other skin changes due to chronic exposure to nonionizing radiation: Secondary | ICD-10-CM | POA: Diagnosis not present

## 2020-03-29 DIAGNOSIS — Z872 Personal history of diseases of the skin and subcutaneous tissue: Secondary | ICD-10-CM | POA: Diagnosis not present

## 2020-03-29 DIAGNOSIS — L814 Other melanin hyperpigmentation: Secondary | ICD-10-CM | POA: Diagnosis not present

## 2020-05-28 DIAGNOSIS — Z1231 Encounter for screening mammogram for malignant neoplasm of breast: Secondary | ICD-10-CM | POA: Diagnosis not present

## 2020-06-16 DIAGNOSIS — W57XXXA Bitten or stung by nonvenomous insect and other nonvenomous arthropods, initial encounter: Secondary | ICD-10-CM | POA: Diagnosis not present

## 2020-06-16 DIAGNOSIS — S70361A Insect bite (nonvenomous), right thigh, initial encounter: Secondary | ICD-10-CM | POA: Diagnosis not present

## 2020-06-16 DIAGNOSIS — R030 Elevated blood-pressure reading, without diagnosis of hypertension: Secondary | ICD-10-CM | POA: Diagnosis not present

## 2020-09-01 ENCOUNTER — Encounter: Payer: Self-pay | Admitting: Internal Medicine

## 2020-09-03 ENCOUNTER — Encounter: Payer: Medicare Other | Admitting: Internal Medicine

## 2020-10-06 ENCOUNTER — Encounter: Payer: Self-pay | Admitting: *Deleted

## 2020-10-06 DIAGNOSIS — R6889 Other general symptoms and signs: Secondary | ICD-10-CM | POA: Diagnosis not present

## 2020-10-06 DIAGNOSIS — U071 COVID-19: Secondary | ICD-10-CM | POA: Diagnosis not present

## 2020-10-06 DIAGNOSIS — R5383 Other fatigue: Secondary | ICD-10-CM | POA: Diagnosis not present

## 2020-10-06 DIAGNOSIS — R490 Dysphonia: Secondary | ICD-10-CM | POA: Diagnosis not present

## 2020-10-15 ENCOUNTER — Ambulatory Visit (AMBULATORY_SURGERY_CENTER): Payer: Medicare Other

## 2020-10-15 ENCOUNTER — Other Ambulatory Visit: Payer: Self-pay

## 2020-10-15 VITALS — Ht 61.0 in | Wt 156.0 lb

## 2020-10-15 DIAGNOSIS — Z1211 Encounter for screening for malignant neoplasm of colon: Secondary | ICD-10-CM

## 2020-10-15 NOTE — Progress Notes (Signed)
Pre visit completed via phone call; patient verified name, DOB, and address;  No egg or soy allergy known to patient  No issues with past sedation with any surgeries or procedures Patient denies ever being told they had issues or difficulty with intubation  No FH of Malignant Hyperthermia No diet pills per patient No home 02 use per patient  No blood thinners per patient  Pt denies issues with constipation  No A fib or A flutter  EMMI video via MyChart  COVID 19 guidelines implemented in PV today with Pt and RN   Pt is fully vaccinated for Covid x 2;  NO PA's for preps discussed with pt in PV today  Discussed with pt there will be an out-of-pocket cost for prep and that varies from $0 to 70 +  dollars   Due to the COVID-19 pandemic we are asking patients to follow certain guidelines.  Pt aware of COVID protocols and [facility] guidelines

## 2020-10-22 ENCOUNTER — Encounter: Payer: Self-pay | Admitting: Internal Medicine

## 2020-10-28 ENCOUNTER — Other Ambulatory Visit: Payer: Self-pay

## 2020-10-28 ENCOUNTER — Encounter: Payer: Self-pay | Admitting: Internal Medicine

## 2020-10-28 ENCOUNTER — Ambulatory Visit (AMBULATORY_SURGERY_CENTER): Payer: Medicare Other | Admitting: Internal Medicine

## 2020-10-28 VITALS — BP 128/85 | HR 80 | Temp 97.1°F | Resp 17 | Ht 61.0 in | Wt 156.0 lb

## 2020-10-28 DIAGNOSIS — D123 Benign neoplasm of transverse colon: Secondary | ICD-10-CM | POA: Diagnosis not present

## 2020-10-28 DIAGNOSIS — Z1211 Encounter for screening for malignant neoplasm of colon: Secondary | ICD-10-CM

## 2020-10-28 DIAGNOSIS — D122 Benign neoplasm of ascending colon: Secondary | ICD-10-CM

## 2020-10-28 MED ORDER — SODIUM CHLORIDE 0.9 % IV SOLN: 500.0000 mL | Freq: Once | INTRAVENOUS | Status: DC

## 2020-10-28 NOTE — Progress Notes (Signed)
Called to room to assist during endoscopic procedure.  Patient ID and intended procedure confirmed with present staff. Received instructions for my participation in the procedure from the performing physician.  

## 2020-10-28 NOTE — Progress Notes (Signed)
PT taken to PACU. Monitors in place. VSS. Report given to RN. 

## 2020-10-28 NOTE — Op Note (Signed)
[place] Patient Name: Susan Ho Procedure Date: 10/28/2020 2:57 PM MRN: [id number] Endoscopist: Gatha Mayer , MD Age: 75 Referring MD:  Date of Birth: Sep 02, 1945 Gender: Female Account #: [id number] Procedure:                Colonoscopy Indications:              Screening for colorectal malignant neoplasm, Last                            colonoscopy: 2011 Medicines:                Propofol per Anesthesia, Monitored Anesthesia Care Procedure:                Pre-Anesthesia Assessment:                           - Prior to the procedure, a History and Physical                            was performed, and patient medications and                            allergies were reviewed. The patient's tolerance of                            previous anesthesia was also reviewed. The risks                            and benefits of the procedure and the sedation                            options and risks were discussed with the patient.                            All questions were answered, and informed consent                            was obtained. Prior Anticoagulants: The patient has                            taken no previous anticoagulant or antiplatelet                            agents. ASA Grade Assessment: II - A patient with                            mild systemic disease. After reviewing the risks                            and benefits, the patient was deemed in                            satisfactory condition to undergo the procedure.  After obtaining informed consent, the colonoscope                            was passed under direct vision. Throughout the                            procedure, the patient's blood pressure, pulse, and                            oxygen saturations were monitored continuously. The                            Olympus PCF-H190DL [id number]) Colonoscope was                            introduced through the  anus and advanced to the the                            cecum, identified by appendiceal orifice and                            ileocecal valve. The colonoscopy was performed                            without difficulty. The patient tolerated the                            procedure well. The quality of the bowel                            preparation was good. The ileocecal valve,                            appendiceal orifice, and rectum were photographed.                            The bowel preparation used was Miralax via split                            dose instruction. Scope In: 3:15:56 PM Scope Out: 3:28:54 PM Scope Withdrawal Time: 0 hours 7 minutes 42 seconds  Total Procedure Duration: 0 hours 12 minutes 58 seconds  Findings:                 The perianal and digital rectal examinations were                            normal.                           A diminutive polyp was found in the transverse                            colon. The polyp was sessile. The polyp was removed  with a cold snare. Resection and retrieval were                            complete. Verification of patient identification                            for the specimen was done. Estimated blood loss was                            minimal.                           Multiple small and large-mouthed diverticula were                            found in the entire colon.                           The exam was otherwise without abnormality on                            direct and retroflexion views. Complications:            No immediate complications. Estimated Blood Loss:     Estimated blood loss was minimal. Impression:               - One diminutive polyp in the transverse colon,                            removed with a cold snare. Resected and retrieved.                           - Severe diverticulosis in the entire examined                            colon.                            - The examination was otherwise normal on direct                            and retroflexion views. Recommendation:           - Patient has a contact number available for                            emergencies. The signs and symptoms of potential                            delayed complications were discussed with the                            patient. Return to normal activities tomorrow.                            Written discharge instructions were provided to the  patient.                           - Resume previous diet.                           - Continue present medications.                           - Await pathology results.                           - No repeat colonoscopy due to age. Gatha Mayer, MD 10/28/2020 3:36:19 PM This report has been signed electronically.

## 2020-10-28 NOTE — Patient Instructions (Addendum)
I found and removed one tiny polyp.  You have diverticulosis - thickened muscle rings and pouches in the colon wall. Please read the handout about this condition.  The polyp looks benign and I do not anticipate that I will recommend another routine colonoscopy.  I appreciate the opportunity to care for you. Gatha Mayer, MD, [facility]  Handouts on diverticulosis and polyps given. Await pathology results.   YOU HAD AN ENDOSCOPIC PROCEDURE TODAY AT [facility]:   Refer to the procedure report that was given to you for any specific questions about what was found during the examination.  If the procedure report does not answer your questions, please call your gastroenterologist to clarify.  If you requested that your care partner not be given the details of your procedure findings, then the procedure report has been included in a sealed envelope for you to review at your convenience later.  YOU SHOULD EXPECT: Some feelings of bloating in the abdomen. Passage of more gas than usual.  Walking can help get rid of the air that was put into your GI tract during the procedure and reduce the bloating. If you had a lower endoscopy (such as a colonoscopy or flexible sigmoidoscopy) you may notice spotting of blood in your stool or on the toilet paper. If you underwent a bowel prep for your procedure, you may not have a normal bowel movement for a few days.  Please Note:  You might notice some irritation and congestion in your nose or some drainage.  This is from the oxygen used during your procedure.  There is no need for concern and it should clear up in a day or so.  SYMPTOMS TO REPORT IMMEDIATELY:  Following lower endoscopy (colonoscopy or flexible sigmoidoscopy):  Excessive amounts of blood in the stool  Significant tenderness or worsening of abdominal pains  Swelling of the abdomen that is new, acute  Fever of 100F or higher   For urgent or emergent issues, a gastroenterologist can  be reached at any hour by calling [phone]. Do not use MyChart messaging for urgent concerns.    DIET:  We do recommend a small meal at first, but then you may proceed to your regular diet.  Drink plenty of fluids but you should avoid alcoholic beverages for 24 hours.  ACTIVITY:  You should plan to take it easy for the rest of today and you should NOT DRIVE or use heavy machinery until tomorrow (because of the sedation medicines used during the test).    FOLLOW UP: Our staff will call the number listed on your records 48-72 hours following your procedure to check on you and address any questions or concerns that you may have regarding the information given to you following your procedure. If we do not reach you, we will leave a message.  We will attempt to reach you two times.  During this call, we will ask if you have developed any symptoms of COVID 19. If you develop any symptoms (ie: fever, flu-like symptoms, shortness of breath, cough etc.) before then, please call [phone].  If you test positive for Covid 19 in the 2 weeks post procedure, please call and report this information to [place].    If any biopsies were taken you will be contacted by phone or by letter within the next 1-3 weeks.  Please call us at [phone] if you have not heard about the biopsies in 3 weeks.    SIGNATURES/CONFIDENTIALITY: You and/or your care  partner have signed paperwork which will be entered into your electronic medical record.  These signatures attest to the fact that that the information above on your After Visit Summary has been reviewed and is understood.  Full responsibility of the confidentiality of this discharge information lies with you and/or your care-partner.  

## 2020-10-28 NOTE — Progress Notes (Signed)
[facility] History and Physical   Primary Care Physician:  Council Mechanic, MD   Reason for Procedure:   Colon cancer screen  Plan:    colonoscopy     HPI: Susan Ho is a 75 y.o. female for screening colonoscopy w/o c/o   Past Medical History:  Diagnosis Date   GERD (gastroesophageal reflux disease)    on meds   Hyperlipidemia    on meds    Past Surgical History:  Procedure Laterality Date   CARPAL TUNNEL RELEASE Right 2007   [place]   COLONOSCOPY  2011   CG-V/F-movi (exc)-tics   LAPAROSCOPY ABDOMEN DIAGNOSTIC     due to infertility   [place]    Prior to Admission medications   Medication Sig Start Date End Date Taking? Authorizing Provider  Calcium Carbonate-Vitamin D (OYSTER SHELL CALCIUM/D) 250-125 MG-UNIT TABS Take 2 tablets by mouth daily. 11/22/17  Yes [provider]  famotidine (PEPCID) 20 MG tablet Take 20 mg by mouth 2 (two) times daily. 09/07/20  Yes [provider]  pravastatin (PRAVACHOL) 40 MG tablet Take 40 mg by mouth daily. 06/28/20  Yes [provider]  UNABLE TO FIND Take 1 Dose by mouth 3 (three) times a week.    [provider]    Current Outpatient Medications  Medication Sig Dispense Refill   Calcium Carbonate-Vitamin D (OYSTER SHELL CALCIUM/D) 250-125 MG-UNIT TABS Take 2 tablets by mouth daily.     famotidine (PEPCID) 20 MG tablet Take 20 mg by mouth 2 (two) times daily.     pravastatin (PRAVACHOL) 40 MG tablet Take 40 mg by mouth daily.     UNABLE TO FIND Take 1 Dose by mouth 3 (three) times a week.     Current Facility-Administered Medications  Medication Dose Route Frequency Provider Last Rate Last Admin   0.9 %  sodium chloride infusion  500 mL Intravenous Once Gatha Mayer, MD        Allergies as of 10/28/2020 - Review Complete 10/28/2020  Allergen Reaction Noted   Sulfa antibiotics Other (See Comments) 11/09/2011    Sulfonamide derivatives      Family History  Problem Relation Age of Onset   Colon polyps Father 55   Colon cancer Neg Hx    Esophageal cancer Neg Hx    Stomach cancer Neg Hx    Rectal cancer Neg Hx     Social History   Socioeconomic History   Marital status: Married    Spouse name: Not on file   Number of children: Not on file   Years of education: Not on file   Highest education level: Not on file  Occupational History   Not on file  Tobacco Use   Smoking status: Never   Smokeless tobacco: Never  Vaping Use   Vaping Use: Never used  Substance and Sexual Activity   Alcohol use: Yes    Alcohol/week: 2.0 standard drinks    Types: 2 Standard drinks or equivalent per week   Drug use: Never   Sexual activity: Not on file  Other Topics Concern   Not on file  Social History Narrative   Not on file   Social Determinants of Health   Financial Resource Strain: Not on file  Food Insecurity: Not on file  Transportation Needs: Not on file  Physical Activity: Not on file  Stress: Not on file  Social Connections: Not on file  Intimate  Partner Violence: Not on file    Review of Systems:  All other review of systems negative except as mentioned in the HPI.  Physical Exam: Vital signs BP (!) 160/78   Pulse 82   Temp (!) 97.1 F (36.2 C)   Resp 18   Ht '5\' 1"'$  (1.549 m)   Wt 156 lb (70.8 kg)   SpO2 100%   BMI 29.48 kg/m   General:   Alert,  Well-developed, well-nourished, pleasant and cooperative in NAD Lungs:  Clear throughout to auscultation.   Heart:  Regular rate and rhythm; no murmurs, clicks, rubs,  or gallops. Abdomen:  Soft, nontender and nondistended. Normal bowel sounds.   Neuro/Psych:  Alert and cooperative. Normal mood and affect. A and O x 3   '@Jemaine Prokop'$  Simonne Maffucci, MD, [facility] [phone] (pager) 10/28/2020 3:13 PM@

## 2020-11-01 ENCOUNTER — Telehealth: Payer: Self-pay | Admitting: *Deleted

## 2020-11-01 NOTE — Telephone Encounter (Signed)
  Follow up Call-  Call back number 10/28/2020  Post procedure Call Back phone  # [phone]  Permission to leave phone message Yes  Some recent data might be hidden     Patient questions:  Do you have a fever, pain , or abdominal swelling? No. Pain Score  0 *  Have you tolerated food without any problems? Yes.    Have you been able to return to your normal activities? Yes.    Do you have any questions about your discharge instructions: Diet   No. Medications  No. Follow up visit  No.  Do you have questions or concerns about your Care? No.  Actions: * If pain score is 4 or above: No action needed, pain <4.  Have you developed a fever since your procedure? no  2.   Have you had an respiratory symptoms (SOB or cough) since your procedure? no  3.   Have you tested positive for COVID 19 since your procedure no  4.   Have you had any family members/close contacts diagnosed with the COVID 19 since your procedure?  no   If yes to any of these questions please route to Joylene John, RN and Joella Prince, RN

## 2020-11-05 ENCOUNTER — Encounter: Payer: Self-pay | Admitting: Internal Medicine

## 2020-12-14 DIAGNOSIS — Z0000 Encounter for general adult medical examination without abnormal findings: Secondary | ICD-10-CM | POA: Diagnosis not present

## 2021-01-13 DIAGNOSIS — H25813 Combined forms of age-related cataract, bilateral: Secondary | ICD-10-CM | POA: Diagnosis not present

## 2021-05-30 DIAGNOSIS — N959 Unspecified menopausal and perimenopausal disorder: Secondary | ICD-10-CM | POA: Diagnosis not present

## 2021-05-30 DIAGNOSIS — L814 Other melanin hyperpigmentation: Secondary | ICD-10-CM | POA: Diagnosis not present

## 2021-05-30 DIAGNOSIS — M8580 Other specified disorders of bone density and structure, unspecified site: Secondary | ICD-10-CM | POA: Diagnosis not present

## 2021-05-30 DIAGNOSIS — Z13820 Encounter for screening for osteoporosis: Secondary | ICD-10-CM | POA: Diagnosis not present

## 2021-05-30 DIAGNOSIS — M8589 Other specified disorders of bone density and structure, multiple sites: Secondary | ICD-10-CM | POA: Diagnosis not present

## 2021-05-30 DIAGNOSIS — Z1231 Encounter for screening mammogram for malignant neoplasm of breast: Secondary | ICD-10-CM | POA: Diagnosis not present

## 2021-05-30 DIAGNOSIS — L821 Other seborrheic keratosis: Secondary | ICD-10-CM | POA: Diagnosis not present

## 2021-05-30 DIAGNOSIS — L578 Other skin changes due to chronic exposure to nonionizing radiation: Secondary | ICD-10-CM | POA: Diagnosis not present

## 2021-05-30 DIAGNOSIS — D1801 Hemangioma of skin and subcutaneous tissue: Secondary | ICD-10-CM | POA: Diagnosis not present

## 2021-09-05 DIAGNOSIS — Z23 Encounter for immunization: Secondary | ICD-10-CM | POA: Diagnosis not present

## 2021-09-05 DIAGNOSIS — M5412 Radiculopathy, cervical region: Secondary | ICD-10-CM | POA: Diagnosis not present

## 2021-12-15 DIAGNOSIS — Z0000 Encounter for general adult medical examination without abnormal findings: Secondary | ICD-10-CM | POA: Diagnosis not present

## 2022-01-17 DIAGNOSIS — H25811 Combined forms of age-related cataract, right eye: Secondary | ICD-10-CM | POA: Diagnosis not present

## 2022-03-01 DIAGNOSIS — M7062 Trochanteric bursitis, left hip: Secondary | ICD-10-CM | POA: Diagnosis not present

## 2022-05-04 DIAGNOSIS — M50122 Cervical disc disorder at C5-C6 level with radiculopathy: Secondary | ICD-10-CM | POA: Diagnosis not present

## 2022-05-29 DIAGNOSIS — I781 Nevus, non-neoplastic: Secondary | ICD-10-CM | POA: Diagnosis not present

## 2022-05-29 DIAGNOSIS — L814 Other melanin hyperpigmentation: Secondary | ICD-10-CM | POA: Diagnosis not present

## 2022-05-29 DIAGNOSIS — D1801 Hemangioma of skin and subcutaneous tissue: Secondary | ICD-10-CM | POA: Diagnosis not present

## 2022-05-29 DIAGNOSIS — L821 Other seborrheic keratosis: Secondary | ICD-10-CM | POA: Diagnosis not present

## 2022-06-01 DIAGNOSIS — R269 Unspecified abnormalities of gait and mobility: Secondary | ICD-10-CM | POA: Diagnosis not present

## 2022-06-01 DIAGNOSIS — M50122 Cervical disc disorder at C5-C6 level with radiculopathy: Secondary | ICD-10-CM | POA: Diagnosis not present

## 2022-06-01 DIAGNOSIS — M6281 Muscle weakness (generalized): Secondary | ICD-10-CM | POA: Diagnosis not present

## 2022-06-01 DIAGNOSIS — R293 Abnormal posture: Secondary | ICD-10-CM | POA: Diagnosis not present

## 2022-06-14 DIAGNOSIS — M130 Polyarthritis, unspecified: Secondary | ICD-10-CM | POA: Diagnosis not present

## 2022-06-15 DIAGNOSIS — R293 Abnormal posture: Secondary | ICD-10-CM | POA: Diagnosis not present

## 2022-06-15 DIAGNOSIS — M6281 Muscle weakness (generalized): Secondary | ICD-10-CM | POA: Diagnosis not present

## 2022-06-15 DIAGNOSIS — R269 Unspecified abnormalities of gait and mobility: Secondary | ICD-10-CM | POA: Diagnosis not present

## 2022-06-15 DIAGNOSIS — M50122 Cervical disc disorder at C5-C6 level with radiculopathy: Secondary | ICD-10-CM | POA: Diagnosis not present

## 2022-06-19 DIAGNOSIS — M6281 Muscle weakness (generalized): Secondary | ICD-10-CM | POA: Diagnosis not present

## 2022-06-19 DIAGNOSIS — R269 Unspecified abnormalities of gait and mobility: Secondary | ICD-10-CM | POA: Diagnosis not present

## 2022-06-19 DIAGNOSIS — R293 Abnormal posture: Secondary | ICD-10-CM | POA: Diagnosis not present

## 2022-06-19 DIAGNOSIS — M50122 Cervical disc disorder at C5-C6 level with radiculopathy: Secondary | ICD-10-CM | POA: Diagnosis not present

## 2022-06-22 DIAGNOSIS — Z1231 Encounter for screening mammogram for malignant neoplasm of breast: Secondary | ICD-10-CM | POA: Diagnosis not present

## 2022-07-06 DIAGNOSIS — M50122 Cervical disc disorder at C5-C6 level with radiculopathy: Secondary | ICD-10-CM | POA: Diagnosis not present

## 2022-07-06 DIAGNOSIS — R293 Abnormal posture: Secondary | ICD-10-CM | POA: Diagnosis not present

## 2022-07-06 DIAGNOSIS — M6281 Muscle weakness (generalized): Secondary | ICD-10-CM | POA: Diagnosis not present

## 2022-07-06 DIAGNOSIS — R269 Unspecified abnormalities of gait and mobility: Secondary | ICD-10-CM | POA: Diagnosis not present

## 2022-07-17 DIAGNOSIS — M5382 Other specified dorsopathies, cervical region: Secondary | ICD-10-CM | POA: Diagnosis not present

## 2022-07-17 DIAGNOSIS — M6281 Muscle weakness (generalized): Secondary | ICD-10-CM | POA: Diagnosis not present

## 2022-07-17 DIAGNOSIS — M79622 Pain in left upper arm: Secondary | ICD-10-CM | POA: Diagnosis not present

## 2022-07-17 DIAGNOSIS — M50122 Cervical disc disorder at C5-C6 level with radiculopathy: Secondary | ICD-10-CM | POA: Diagnosis not present

## 2022-08-29 DIAGNOSIS — M6281 Muscle weakness (generalized): Secondary | ICD-10-CM | POA: Diagnosis not present

## 2022-08-29 DIAGNOSIS — M79622 Pain in left upper arm: Secondary | ICD-10-CM | POA: Diagnosis not present

## 2022-08-29 DIAGNOSIS — M50122 Cervical disc disorder at C5-C6 level with radiculopathy: Secondary | ICD-10-CM | POA: Diagnosis not present

## 2022-08-29 DIAGNOSIS — M5382 Other specified dorsopathies, cervical region: Secondary | ICD-10-CM | POA: Diagnosis not present

## 2022-09-04 DIAGNOSIS — G8929 Other chronic pain: Secondary | ICD-10-CM | POA: Diagnosis not present

## 2022-09-04 DIAGNOSIS — M25561 Pain in right knee: Secondary | ICD-10-CM | POA: Diagnosis not present

## 2022-09-05 DIAGNOSIS — G8929 Other chronic pain: Secondary | ICD-10-CM | POA: Diagnosis not present

## 2022-09-05 DIAGNOSIS — M25561 Pain in right knee: Secondary | ICD-10-CM | POA: Diagnosis not present

## 2022-09-20 DIAGNOSIS — H9201 Otalgia, right ear: Secondary | ICD-10-CM | POA: Diagnosis not present

## 2022-09-20 DIAGNOSIS — Z1330 Encounter for screening examination for mental health and behavioral disorders, unspecified: Secondary | ICD-10-CM | POA: Diagnosis not present

## 2022-09-20 DIAGNOSIS — R300 Dysuria: Secondary | ICD-10-CM | POA: Diagnosis not present

## 2022-09-27 DIAGNOSIS — Z23 Encounter for immunization: Secondary | ICD-10-CM | POA: Diagnosis not present

## 2022-09-28 DIAGNOSIS — M6281 Muscle weakness (generalized): Secondary | ICD-10-CM | POA: Diagnosis not present

## 2022-09-28 DIAGNOSIS — M5382 Other specified dorsopathies, cervical region: Secondary | ICD-10-CM | POA: Diagnosis not present

## 2022-09-28 DIAGNOSIS — M50122 Cervical disc disorder at C5-C6 level with radiculopathy: Secondary | ICD-10-CM | POA: Diagnosis not present

## 2022-09-28 DIAGNOSIS — M79622 Pain in left upper arm: Secondary | ICD-10-CM | POA: Diagnosis not present

## 2022-12-25 DIAGNOSIS — Z0000 Encounter for general adult medical examination without abnormal findings: Secondary | ICD-10-CM | POA: Diagnosis not present

## 2023-01-24 DIAGNOSIS — H25811 Combined forms of age-related cataract, right eye: Secondary | ICD-10-CM | POA: Diagnosis not present
